# Patient Record
Sex: Male | Born: 1980 | Race: Black or African American | Hispanic: No | Marital: Single | Smoking: Former smoker
Health system: Southern US, Community
[De-identification: ages and names within clinical notes are randomized; demographics above are authoritative.]

## PROBLEM LIST (undated history)

## (undated) HISTORY — PX: FINGER SURGERY: SHX640

---

## 2009-03-04 ENCOUNTER — Ambulatory Visit: Payer: Self-pay | Admitting: Diagnostic Radiology

## 2009-03-04 ENCOUNTER — Emergency Department (HOSPITAL_BASED_OUTPATIENT_CLINIC_OR_DEPARTMENT_OTHER): Admission: EM | Admit: 2009-03-04 | Discharge: 2009-03-04 | Payer: Self-pay | Admitting: Emergency Medicine

## 2010-01-02 ENCOUNTER — Emergency Department (HOSPITAL_BASED_OUTPATIENT_CLINIC_OR_DEPARTMENT_OTHER): Admission: EM | Admit: 2010-01-02 | Discharge: 2010-01-02 | Payer: Self-pay | Admitting: Emergency Medicine

## 2010-01-02 ENCOUNTER — Ambulatory Visit: Payer: Self-pay | Admitting: Diagnostic Radiology

## 2011-12-07 ENCOUNTER — Encounter (HOSPITAL_BASED_OUTPATIENT_CLINIC_OR_DEPARTMENT_OTHER): Payer: Self-pay | Admitting: Emergency Medicine

## 2011-12-07 ENCOUNTER — Emergency Department (INDEPENDENT_AMBULATORY_CARE_PROVIDER_SITE_OTHER): Payer: No Typology Code available for payment source

## 2011-12-07 ENCOUNTER — Emergency Department (HOSPITAL_BASED_OUTPATIENT_CLINIC_OR_DEPARTMENT_OTHER)
Admission: EM | Admit: 2011-12-07 | Discharge: 2011-12-07 | Disposition: A | Discharge status: Home / Self Care | Payer: No Typology Code available for payment source | Attending: Emergency Medicine | Admitting: Emergency Medicine

## 2011-12-07 DIAGNOSIS — M25569 Pain in unspecified knee: Secondary | ICD-10-CM

## 2011-12-07 DIAGNOSIS — Z79899 Other long term (current) drug therapy: Secondary | ICD-10-CM | POA: Insufficient documentation

## 2011-12-07 DIAGNOSIS — Y92410 Unspecified street and highway as the place of occurrence of the external cause: Secondary | ICD-10-CM | POA: Insufficient documentation

## 2011-12-07 DIAGNOSIS — M549 Dorsalgia, unspecified: Secondary | ICD-10-CM

## 2011-12-07 DIAGNOSIS — M545 Low back pain: Secondary | ICD-10-CM

## 2011-12-07 MED ORDER — HYDROCODONE-ACETAMINOPHEN 5-325 MG PO TABS
1.0000 | ORAL_TABLET | Freq: Once | ORAL | Status: AC
  Administered 2011-12-07: 1 via ORAL
  Filled 2011-12-07: qty 1

## 2011-12-07 MED ORDER — OXYCODONE-ACETAMINOPHEN 5-325 MG PO TABS: 1.0000 | ORAL_TABLET | ORAL | Status: AC | PRN

## 2011-12-07 MED ORDER — CYCLOBENZAPRINE HCL 10 MG PO TABS: 10.0000 mg | ORAL_TABLET | Freq: Two times a day (BID) | ORAL | Status: AC | PRN

## 2011-12-07 MED ORDER — IBUPROFEN 800 MG PO TABS: 800.0000 mg | ORAL_TABLET | Freq: Three times a day (TID) | ORAL | Status: AC

## 2011-12-07 MED ORDER — IBUPROFEN 800 MG PO TABS
800.0000 mg | ORAL_TABLET | Freq: Once | ORAL | Status: AC
  Administered 2011-12-07: 800 mg via ORAL
  Filled 2011-12-07: qty 1
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
## 2012-11-12 ENCOUNTER — Emergency Department (HOSPITAL_BASED_OUTPATIENT_CLINIC_OR_DEPARTMENT_OTHER)
Admission: EM | Admit: 2012-11-12 | Discharge: 2012-11-12 | Disposition: A | Discharge status: Home / Self Care | Payer: Managed Care, Other (non HMO) | Attending: Emergency Medicine | Admitting: Emergency Medicine

## 2012-11-12 ENCOUNTER — Emergency Department (HOSPITAL_BASED_OUTPATIENT_CLINIC_OR_DEPARTMENT_OTHER): Payer: Managed Care, Other (non HMO)

## 2012-11-12 ENCOUNTER — Encounter (HOSPITAL_BASED_OUTPATIENT_CLINIC_OR_DEPARTMENT_OTHER): Payer: Self-pay | Admitting: *Deleted

## 2012-11-12 DIAGNOSIS — J069 Acute upper respiratory infection, unspecified: Secondary | ICD-10-CM | POA: Insufficient documentation

## 2012-11-12 MED ORDER — HYDROCOD POLST-CHLORPHEN POLST 10-8 MG/5ML PO LQCR: 5.0000 mL | Freq: Two times a day (BID) | ORAL | Status: DC | PRN

## 2012-11-12 NOTE — ED Notes (Signed)
Productive cough with yellow sputum. Aching all over and fever. Symptoms started yesterday.

## 2012-11-12 NOTE — ED Provider Notes (Signed)
History     CSN: [id number]  Arrival date & time 11/12/12  1325   First MD Initiated Contact with Patient 11/12/12 1348      Chief Complaint  Patient presents with  . Cough    (Consider location/radiation/quality/duration/timing/severity/associated sxs/prior treatment) Patient is a 32 y.o. male presenting with cough. The history is provided by the patient.  Cough This is a new problem. The current episode started yesterday. The problem occurs constantly. The problem has been rapidly worsening. The cough is productive of sputum. There has been no fever. Pertinent negatives include no chest pain, no sore throat and no shortness of breath. He has tried nothing for the symptoms. The treatment provided no relief. He is not a smoker.    History reviewed. No pertinent past medical history.  Past Surgical History  Procedure Date  . Finger surgery     No family history on file.  History  Substance Use Topics  . Smoking status: Never Smoker   . Smokeless tobacco: Not on file  . Alcohol Use: No      Review of Systems  HENT: Negative for sore throat.   Respiratory: Positive for cough. Negative for shortness of breath.   Cardiovascular: Negative for chest pain.  All other systems reviewed and are negative.    Allergies  Review of patient's allergies indicates no known allergies.  Home Medications  No current outpatient prescriptions on file.  BP 151/78  Pulse 99  Temp 99.6 F (37.6 C) (Oral)  Resp 20  Ht 6' (1.829 m)  Wt 200 lb (90.719 kg)  BMI 27.12 kg/m2  SpO2 100%  Physical Exam  Nursing note and vitals reviewed. Constitutional: He is oriented to person, place, and time. He appears well-developed and well-nourished. No distress.  HENT:  Head: Normocephalic and atraumatic.  Mouth/Throat: Oropharynx is clear and moist.  Neck: Normal range of motion. Neck supple.  Cardiovascular: Normal rate and regular rhythm.   No murmur heard. Pulmonary/Chest: Effort normal  and breath sounds normal. No respiratory distress. He has no wheezes.  Abdominal: Soft. Bowel sounds are normal. He exhibits no distension. There is no tenderness.  Musculoskeletal: Normal range of motion. He exhibits no edema.  Lymphadenopathy:    He has no cervical adenopathy.  Neurological: He is alert and oriented to person, place, and time.  Skin: Skin is warm and dry. He is not diaphoretic.    ED Course  Procedures (including critical care time)  Labs Reviewed - No data to display Dg Chest 2 View  11/12/2012  *RADIOLOGY REPORT*  Clinical Data: Cough and fever  CHEST - 2 VIEW  Comparison: 01/02/2010  Findings: The heart and pulmonary vascularity are within normal limits.  The lungs are clear bilaterally.  Bony structures are within normal limits.  IMPRESSION: No acute abnormality is noted.   Original Report Authenticated By: Alcide Clever, M.D.      No diagnosis found.    MDM  The patient presents with chest congestion and cough.  The xray is negative.  He will be treated with tussionex, return prn.        Geoffery Lyons, MD 11/12/12 [phone]

## 2016-02-22 ENCOUNTER — Encounter (HOSPITAL_BASED_OUTPATIENT_CLINIC_OR_DEPARTMENT_OTHER): Payer: Self-pay | Admitting: *Deleted

## 2016-02-22 DIAGNOSIS — Y929 Unspecified place or not applicable: Secondary | ICD-10-CM | POA: Diagnosis not present

## 2016-02-22 DIAGNOSIS — Y999 Unspecified external cause status: Secondary | ICD-10-CM | POA: Insufficient documentation

## 2016-02-22 DIAGNOSIS — S39012A Strain of muscle, fascia and tendon of lower back, initial encounter: Secondary | ICD-10-CM | POA: Diagnosis not present

## 2016-02-22 DIAGNOSIS — Y939 Activity, unspecified: Secondary | ICD-10-CM | POA: Diagnosis not present

## 2016-02-22 DIAGNOSIS — S3992XA Unspecified injury of lower back, initial encounter: Secondary | ICD-10-CM | POA: Diagnosis present
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
## 2016-02-23 ENCOUNTER — Emergency Department (HOSPITAL_BASED_OUTPATIENT_CLINIC_OR_DEPARTMENT_OTHER)
Admission: EM | Admit: 2016-02-23 | Discharge: 2016-02-23 | Disposition: A | Discharge status: Home / Self Care | Payer: No Typology Code available for payment source | Attending: Emergency Medicine | Admitting: Emergency Medicine

## 2016-02-23 DIAGNOSIS — T148XXA Other injury of unspecified body region, initial encounter: Secondary | ICD-10-CM

## 2016-02-23 MED ORDER — IBUPROFEN 800 MG PO TABS: 800.0000 mg | ORAL_TABLET | Freq: Three times a day (TID) | ORAL | Status: DC

## 2016-02-23 MED ORDER — CYCLOBENZAPRINE HCL 10 MG PO TABS: 10.0000 mg | ORAL_TABLET | Freq: Two times a day (BID) | ORAL | Status: DC | PRN

## 2016-02-23 NOTE — ED Notes (Signed)
Pt denies any further needs at this time.  He is on facebook during discharge.
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
## 2018-01-12 ENCOUNTER — Emergency Department (HOSPITAL_BASED_OUTPATIENT_CLINIC_OR_DEPARTMENT_OTHER)
Admission: EM | Admit: 2018-01-12 | Discharge: 2018-01-12 | Disposition: A | Discharge status: Home / Self Care | Payer: Self-pay | Attending: Emergency Medicine | Admitting: Emergency Medicine

## 2018-01-12 ENCOUNTER — Encounter (HOSPITAL_BASED_OUTPATIENT_CLINIC_OR_DEPARTMENT_OTHER): Payer: Self-pay | Admitting: *Deleted

## 2018-01-12 ENCOUNTER — Other Ambulatory Visit: Payer: Self-pay

## 2018-01-12 DIAGNOSIS — M545 Low back pain, unspecified: Secondary | ICD-10-CM

## 2018-01-12 MED ORDER — METHOCARBAMOL 500 MG PO TABS: 500.0000 mg | ORAL_TABLET | Freq: Two times a day (BID) | ORAL | 0 refills | Status: DC

## 2018-01-12 MED ORDER — PREDNISONE 20 MG PO TABS: 40.0000 mg | ORAL_TABLET | Freq: Every day | ORAL | 0 refills | Status: DC

## 2018-01-12 MED ORDER — OXYCODONE-ACETAMINOPHEN 5-325 MG PO TABS
1.0000 | ORAL_TABLET | Freq: Once | ORAL | Status: AC
  Administered 2018-01-12: 1 via ORAL
  Filled 2018-01-12: qty 1

## 2018-01-12 MED ORDER — KETOROLAC TROMETHAMINE 60 MG/2ML IM SOLN
60.0000 mg | Freq: Once | INTRAMUSCULAR | Status: AC
  Administered 2018-01-12: 60 mg via INTRAMUSCULAR
  Filled 2018-01-12: qty 2

## 2018-01-12 NOTE — Discharge Instructions (Signed)
Take 800 mg ibuprofen every 6 hours for pain.   Take steroid for next 5 days to help with inflammation.   I have also written you a description for muscle relaxer medicine (Robaxin.)  This medicine can make you drowsy so please do not drive or work while taking it.  You can apply heat to the lower back to help with your symptoms as well.  Return to the emergency department if you have loss of bowel or bladder control, weakness in which he can no longer walk, loss of sensation in your feet or legs or have any new or concerning symptoms.
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
## 2018-01-12 NOTE — ED Provider Notes (Signed)
[facility] EMERGENCY DEPARTMENT Provider Note   CSN: [id number] Arrival date & time: 01/12/18  1932     History   Chief Complaint Chief Complaint  Patient presents with  . Back Pain    HPI Jose Bean is a 37 y.o. male.  HPI  Jose Bean is a 37yo male with no significant past medical history who presents to the emergency department for evaluation of back pain.  Patient reports that earlier today he bent over to pick up a car battery and pulled out his back.  Reports that he now has 10/10 severity left lower back pain which does not radiate.  Pain is worsened when he tries to stand from a seated position.  He has not taken any over-the-counter medications for his symptoms.  Denies history of back problems in the past.  Denies fevers, chills, night sweats, unexpected weight changes, numbness, weakness, saddle anesthesia, loss of bowel or bladder control, abdominal pain, nausea/vomiting, dysuria, urinary frequency.  Denies history of IV drug use or personal history of cancer. He is able to ambulate independently despite pain.  History reviewed. No pertinent past medical history.  There are no active problems to display for this patient.   Past Surgical History:  Procedure Laterality Date  . FINGER SURGERY         Home Medications    Prior to Admission medications   Not on File    Family History History reviewed. No pertinent family history.  Social History Social History   Tobacco Use  . Smoking status: Never Smoker  Substance Use Topics  . Alcohol use: No  . Drug use: Yes    Types: Marijuana     Allergies   Patient has no known allergies.   Review of Systems Review of Systems  Constitutional: Negative for chills and fever.  Gastrointestinal: Negative for abdominal pain, nausea and vomiting.  Genitourinary: Negative for difficulty urinating, dysuria, frequency and hematuria.  Musculoskeletal: Positive for back pain. Negative for gait  problem.  Skin: Negative for rash and wound.  Neurological: Negative for weakness and numbness.     Physical Exam Updated Vital Signs BP 132/73 (BP Location: Right Arm)   Pulse 87   Temp 98.3 F (36.8 C) (Oral)   Resp 18   Ht 6' (1.829 m)   Wt 99.8 kg (220 lb)   SpO2 100%   BMI 29.84 kg/m   Physical Exam  Constitutional: He is oriented to person, place, and time. He appears well-developed and well-nourished. No distress.  HENT:  Head: Normocephalic and atraumatic.  Eyes: Right eye exhibits no discharge. Left eye exhibits no discharge.  Pulmonary/Chest: Effort normal. No respiratory distress.  Abdominal: Soft. Bowel sounds are normal. There is no tenderness. There is no guarding.  No CVA tenderness.  Musculoskeletal:  No midline t-spine or l-spine tenderness. Tender to palpation in left paraspinal muscles of the lumbar spine. No overlying rash or ecchymosis. Strength 5/5 in bilateral knee flexion/extension. DP pulses 2+ bilaterally.   Neurological: He is alert and oriented to person, place, and time. Coordination normal.  Patellar reflex 2+ and symmetric bilaterally.  Distal sensation to light touch intact in bilateral lower extremities.  Gait painful, although normal and coordination and balance.  Skin: Skin is warm and dry. Capillary refill takes less than 2 seconds. He is not diaphoretic.  Psychiatric: He has a normal mood and affect. His behavior is normal.  Nursing note and vitals reviewed.    ED Treatments / Results  Labs (  all labs ordered are listed, but only abnormal results are displayed) Labs Reviewed - No data to display  EKG  EKG Interpretation None       Radiology No results found.  Procedures Procedures (including critical care time)  Medications Ordered in ED Medications  oxyCODONE-acetaminophen (PERCOCET/ROXICET) 5-325 MG per tablet 1 tablet (1 tablet Oral Given 01/12/18 2052)  ketorolac (TORADOL) injection 60 mg (60 mg Intramuscular Given 01/12/18  2052)  oxyCODONE-acetaminophen (PERCOCET/ROXICET) 5-325 MG per tablet 1 tablet (1 tablet Oral Given 01/12/18 2120)     Initial Impression / Assessment and Plan / ED Course  I have reviewed the triage vital signs and the nursing notes.  Pertinent labs & imaging results that were available during my care of the patient were reviewed by me and considered in my medical decision making (see chart for details).     Presentation and exam consistent with musculoskeletal back pain. No neurological deficits and normal neuro exam.  Patient can walk but states is painful. No loss of bowel or bladder control. No concern for cauda equina.  No fever, night sweats, weight loss, h/o cancer, IVDU. RICE protocol and pain medicine indicated and discussed with patient.  Counseled patient that muscle relaxer can make him drowsy and he should not drive or work while taking it. Discussed return precautions and patient agrees and voices understanding to the above plan.   Final Clinical Impressions(s) / ED Diagnoses   Final diagnoses:  Acute left-sided low back pain without sciatica    ED Discharge Orders        Ordered    methocarbamol (ROBAXIN) 500 MG tablet  2 times daily     01/12/18 2107    predniSONE (DELTASONE) 20 MG tablet  Daily     01/12/18 2107       Kellie ShropshireShrosbree, Faiza Bansal J, PA-C 01/13/18 0100    Arby BarrettePfeiffer, Marcy, MD 01/16/18 [phone]

## 2018-07-05 ENCOUNTER — Encounter (HOSPITAL_BASED_OUTPATIENT_CLINIC_OR_DEPARTMENT_OTHER): Payer: Self-pay | Admitting: Emergency Medicine

## 2018-07-05 ENCOUNTER — Other Ambulatory Visit: Payer: Self-pay

## 2018-07-05 ENCOUNTER — Emergency Department (HOSPITAL_BASED_OUTPATIENT_CLINIC_OR_DEPARTMENT_OTHER)
Admission: EM | Admit: 2018-07-05 | Discharge: 2018-07-05 | Disposition: A | Discharge status: Home / Self Care | Payer: Self-pay | Attending: Emergency Medicine | Admitting: Emergency Medicine

## 2018-07-05 DIAGNOSIS — R1030 Lower abdominal pain, unspecified: Secondary | ICD-10-CM | POA: Insufficient documentation

## 2018-07-05 DIAGNOSIS — R197 Diarrhea, unspecified: Secondary | ICD-10-CM | POA: Insufficient documentation

## 2018-07-05 DIAGNOSIS — F17290 Nicotine dependence, other tobacco product, uncomplicated: Secondary | ICD-10-CM | POA: Insufficient documentation

## 2018-07-05 MED ORDER — DICYCLOMINE HCL 20 MG PO TABS: 20.0000 mg | ORAL_TABLET | Freq: Two times a day (BID) | ORAL | 0 refills | Status: AC

## 2018-07-05 MED ORDER — ONDANSETRON 4 MG PO TBDP: 4.0000 mg | ORAL_TABLET | Freq: Three times a day (TID) | ORAL | 0 refills | Status: AC | PRN

## 2018-07-05 MED ORDER — DICYCLOMINE HCL 10 MG PO CAPS
10.0000 mg | ORAL_CAPSULE | Freq: Once | ORAL | Status: AC
Start: 2018-07-05 — End: 2018-07-05
  Administered 2018-07-05: 10 mg via ORAL
  Filled 2018-07-05: qty 1

## 2018-07-05 NOTE — ED Triage Notes (Signed)
Generalized intermittent abdominal pain since Saturday.  2 episodes diarrhea in last 24 hours.  No N/V.

## 2018-07-05 NOTE — Discharge Instructions (Addendum)
You were evaluated in the Emergency Department and after careful evaluation, we did not find any emergent condition requiring admission or further testing in the hospital.  Your symptoms today seem to be due to a virus causing diarrhea, abdominal cramping related to mild irritation.  Please use the medications provided as needed for your symptoms.  As discussed, if your pain becomes worse or if you experience fevers, please return to emergency department for evaluation.  Please return to the Emergency Department if you experience any worsening of your condition.  We encourage you to follow up with a primary care provider.  Thank you for allowing us to be a part of your care.

## 2018-07-05 NOTE — ED Provider Notes (Signed)
[facility] Emergency Department Provider Note MRN:  [id number]  Arrival date & time: 07/05/18     Chief Complaint   Abdominal Pain   History of Present Illness   Jose Bean is a 37 y.o. year-old male with no pertinent past medical history presenting to the ED with chief complaint of abdominal pain.  For the past 2 to 3 days, patient has been experiencing constant, watery diarrhea.  Denies blood in the stool.  Last night, began expressing gradual onset diffuse abdominal cramping sensation, mild in severity, constant, mostly in the bilateral lower abdominal regions.  Denies nausea or vomiting, no fevers, no chest pain or shortness of breath, no upper abdominal pain, no dysuria.  No exacerbating relieving factors.  Review of Systems  A complete 10 system review of systems was obtained and all systems are negative except as noted in the HPI and PMH.   Patient's Health History   No past medical history on file.  Past Surgical History:  Procedure Laterality Date  . FINGER SURGERY      No family history on file.  Social History   Socioeconomic History  . Marital status: Single    Spouse name: Not on file  . Number of children: Not on file  . Years of education: Not on file  . Highest education level: Not on file  Occupational History  . Not on file  Social Needs  . Financial resource strain: Not on file  . Food insecurity:    Worry: Not on file    Inability: Not on file  . Transportation needs:    Medical: Not on file    Non-medical: Not on file  Tobacco Use  . Smoking status: Current Some Day Smoker    Types: Cigars  . Smokeless tobacco: Never Used  Substance and Sexual Activity  . Alcohol use: No  . Drug use: Yes    Types: Marijuana  . Sexual activity: Not on file  Lifestyle  . Physical activity:    Days per week: Not on file    Minutes per session: Not on file  . Stress: Not on file  Relationships  . Social connections:    Talks  on phone: Not on file    Gets together: Not on file    Attends religious service: Not on file    Active member of club or organization: Not on file    Attends meetings of clubs or organizations: Not on file    Relationship status: Not on file  . Intimate partner violence:    Fear of current or ex partner: Not on file    Emotionally abused: Not on file    Physically abused: Not on file    Forced sexual activity: Not on file  Other Topics Concern  . Not on file  Social History Narrative  . Not on file     Physical Exam  Vital Signs and Nursing Notes reviewed Vitals:   07/05/18 0915  BP: 136/82  Pulse: 79  Resp: 14  Temp: 98.5 F (36.9 C)  SpO2: 100%    CONSTITUTIONAL: Well-appearing, NAD NEURO:  Alert and oriented x 3, no focal deficits EYES:  eyes equal and reactive ENT/NECK:  no LAD, no JVD CARDIO: Regular rate, well-perfused, normal S1 and S2 PULM:  CTAB no wheezing or rhonchi GI/GU:  normal bowel sounds, non-distended, non-tender MSK/SPINE:  No gross deformities, no edema SKIN:  no rash, atraumatic PSYCH:  Appropriate speech and behavior  Diagnostic and  Interventional Summary    EKG Interpretation  Date/Time:    Ventricular Rate:    PR Interval:    QRS Duration:   QT Interval:    QTC Calculation:   R Axis:     Text Interpretation:        Labs Reviewed - No data to display  No orders to display    Medications  dicyclomine (BENTYL) capsule 10 mg (has no administration in time range)     Procedures Critical Care  ED Course and Medical Decision Making  I have reviewed the triage vital signs and the nursing notes.  Pertinent labs & imaging results that were available during my care of the patient were reviewed by me and considered in my medical decision making (see below for details).    Very soft and reassuring abdominal exam in this healthy 37 year old male presenting with likely viral diarrhea.  Vital signs stable, afebrile, no focal tenderness, no  McBurney's point tenderness, very low concern for appendicitis at this time.  Will provide symptomatic control with prescriptions for Bentyl and Zofran if nausea were to develop.  Provided specific instructions to return to the ED with worsening of pain or pain becoming more focal in the right lower quadrant.  After the discussed management above, the patient was determined to be safe for discharge.  The patient was in agreement with this plan and all questions regarding their care were answered.  ED return precautions were discussed and the patient will return to the ED with any significant worsening of condition.  Elmer Sow. Pilar Plate, MD [facility] Emergency Medicine [facility] [email or web address]  Final Clinical Impressions(s) / ED Diagnoses     ICD-10-CM   1. Diarrhea of presumed infectious origin R19.7   2. Lower abdominal pain R10.30     ED Discharge Orders    None         Sabas Sous, MD 07/05/18 1012

## 2018-07-06 ENCOUNTER — Other Ambulatory Visit: Payer: Self-pay

## 2018-07-06 ENCOUNTER — Encounter (HOSPITAL_BASED_OUTPATIENT_CLINIC_OR_DEPARTMENT_OTHER): Payer: Self-pay | Admitting: *Deleted

## 2018-07-06 ENCOUNTER — Emergency Department (HOSPITAL_BASED_OUTPATIENT_CLINIC_OR_DEPARTMENT_OTHER)
Admission: EM | Admit: 2018-07-06 | Discharge: 2018-07-06 | Disposition: A | Discharge status: Home / Self Care | Payer: Self-pay | Attending: Emergency Medicine | Admitting: Emergency Medicine

## 2018-07-06 DIAGNOSIS — F17290 Nicotine dependence, other tobacco product, uncomplicated: Secondary | ICD-10-CM | POA: Insufficient documentation

## 2018-07-06 DIAGNOSIS — R1012 Left upper quadrant pain: Secondary | ICD-10-CM | POA: Insufficient documentation

## 2018-07-06 NOTE — ED Provider Notes (Signed)
[facility] EMERGENCY DEPARTMENT Provider Note  CSN: [id number] Arrival date & time: 07/06/18  1438    History   Chief Complaint No chief complaint on file.   HPI Jose Bean is a 37 y.o. male with no significant medical history who presented to the ED for abdominal pain. Patient describes an acute episode of LUQ abdominal pain that lasted ~ 15 to 30 minutes, but resolved becoming to the ED without intervention. Denies fever, chest pain, SOB, palpitations, leg swelling or N/V. Patient states he came to the ED yesterday 07/05/18 for similar complaints and diarrhea. He states diarrhea has resolved and that he had a normal bowel movement today.  History reviewed. No pertinent past medical history.  There are no active problems to display for this patient.   Past Surgical History:  Procedure Laterality Date  . FINGER SURGERY          Home Medications    Prior to Admission medications   Medication Sig Start Date End Date Taking? Authorizing Provider  dicyclomine (BENTYL) 20 MG tablet Take 1 tablet (20 mg total) by mouth 2 (two) times daily. 07/05/18   Sabas SousBero, Michael M, MD  ondansetron (ZOFRAN ODT) 4 MG disintegrating tablet Take 1 tablet (4 mg total) by mouth every 8 (eight) hours as needed for nausea or vomiting. 07/05/18   Sabas SousBero, Michael M, MD    Family History No family history on file.  Social History Social History   Tobacco Use  . Smoking status: Current Some Day Smoker    Types: Cigars  . Smokeless tobacco: Never Used  Substance Use Topics  . Alcohol use: No  . Drug use: Yes    Types: Marijuana     Allergies   Patient has no known allergies.   Review of Systems Review of Systems  Constitutional: Negative for fever.  Respiratory: Negative for chest tightness and shortness of breath.   Cardiovascular: Negative for chest pain, palpitations and leg swelling.  Gastrointestinal: Positive for abdominal pain. Negative for blood in stool, constipation,  diarrhea, nausea and vomiting.  Musculoskeletal: Negative.   Skin: Negative.      Physical Exam Updated Vital Signs BP 119/79   Pulse 74   Temp 97.8 F (36.6 C) (Oral)   Resp 18   Ht 6' (1.829 m)   Wt 98.8 kg   SpO2 100%   BMI 29.54 kg/m   Physical Exam  Constitutional: Vital signs are normal. He appears well-developed and well-nourished. No distress.  Cardiovascular: Normal rate, regular rhythm and normal heart sounds.  Pulmonary/Chest: Effort normal and breath sounds normal.  Abdominal: Soft. Normal appearance and bowel sounds are normal. He exhibits no distension. There is no tenderness.  Dullness to percussion in lower quadrants.  Skin: Skin is warm and intact. Capillary refill takes less than 2 seconds.  Psychiatric: He has a normal mood and affect. His speech is normal and behavior is normal. Thought content normal. Cognition and memory are normal.  Nursing note and vitals reviewed.  ED Treatments / Results  Labs (all labs ordered are listed, but only abnormal results are displayed) Labs Reviewed - No data to display  EKG None  Radiology No results found.  Procedures Procedures (including critical care time)  Medications Ordered in ED Medications - No data to display   Initial Impression / Assessment and Plan / ED Course  Triage vital signs and the nursing notes have been reviewed.  Pertinent labs & imaging results that were available during care of the  patient were reviewed and considered in medical decision making (see chart for details).  Patient presents for an acute episode of left sided abdominal pain that resolved before arrival to the ED. There were no other associated symptoms during this episode that suggestive an infectious or cardiac process. Physical exam today was normal. Patient seen in the ED yesterday 07/05/18 for abdominal complaints. Based on medical record review, there have been no acute changes or worsening in patient's physical  complaints. In fact, patient appears to have improved. Prior to this provider's evaluation, patient states that he was going to leave the ED and take some laxatives at home because he think he could be constipated.   Final Clinical Impressions(s) / ED Diagnoses  1. LUQ Pain. Education provided on OTC and symptomatic treatments for relief. Education on strict return precautions.  Dispo: Home. After thorough clinical evaluation, this patient is determined to be medically stable and can be safely discharged with the previously mentioned treatment and/or outpatient follow-up/referral(s). At this time, there are no other apparent medical conditions that require further screening, evaluation or treatment.   Final diagnoses:  Left upper quadrant pain    ED Discharge Orders    None        Windy Carina, PA-C 07/06/18 1806    Virgina Norfolk, DO 07/07/18 1120

## 2018-07-06 NOTE — ED Triage Notes (Signed)
Abdominal pain that came and went away after having a BM. He was seen yesterday for same.

## 2018-07-06 NOTE — ED Notes (Signed)
Pt left department before receiving d/c paperwork. Pt approached this RN in the hallway and reported he was just going to go home.

## 2018-07-06 NOTE — Discharge Instructions (Addendum)
Your physical exam suggests that you may be constipated. I recommend that you take over the counter laxatives or stool softeners over the next couple of days to assist with this. You may continue taking the dicylcomine (Bentyl) that you were given yesterday for stomach cramps.  Below are reasons to follow-up to the emergency room: fever; abdominal pain that does not go away; abdominal pain with vomiting (more than 3 times); blood in your poop or dark poop.

## 2018-07-10 ENCOUNTER — Other Ambulatory Visit: Payer: Self-pay

## 2018-07-10 ENCOUNTER — Encounter (HOSPITAL_BASED_OUTPATIENT_CLINIC_OR_DEPARTMENT_OTHER): Payer: Self-pay | Admitting: *Deleted

## 2018-07-10 ENCOUNTER — Emergency Department (HOSPITAL_BASED_OUTPATIENT_CLINIC_OR_DEPARTMENT_OTHER)
Admission: EM | Admit: 2018-07-10 | Discharge: 2018-07-10 | Disposition: A | Discharge status: Home / Self Care | Payer: Self-pay | Attending: Emergency Medicine | Admitting: Emergency Medicine

## 2018-07-10 ENCOUNTER — Emergency Department (HOSPITAL_BASED_OUTPATIENT_CLINIC_OR_DEPARTMENT_OTHER): Payer: Worker's Compensation

## 2018-07-10 DIAGNOSIS — Y939 Activity, unspecified: Secondary | ICD-10-CM | POA: Insufficient documentation

## 2018-07-10 DIAGNOSIS — F17200 Nicotine dependence, unspecified, uncomplicated: Secondary | ICD-10-CM | POA: Insufficient documentation

## 2018-07-10 DIAGNOSIS — Y990 Civilian activity done for income or pay: Secondary | ICD-10-CM | POA: Insufficient documentation

## 2018-07-10 DIAGNOSIS — S39012A Strain of muscle, fascia and tendon of lower back, initial encounter: Secondary | ICD-10-CM | POA: Insufficient documentation

## 2018-07-10 DIAGNOSIS — Y9259 Other trade areas as the place of occurrence of the external cause: Secondary | ICD-10-CM | POA: Insufficient documentation

## 2018-07-10 MED ORDER — NAPROXEN 500 MG PO TABS: 500.0000 mg | ORAL_TABLET | Freq: Two times a day (BID) | ORAL | 0 refills | Status: AC

## 2018-07-10 MED ORDER — CYCLOBENZAPRINE HCL 10 MG PO TABS: 10.0000 mg | ORAL_TABLET | Freq: Two times a day (BID) | ORAL | 0 refills | Status: AC | PRN

## 2018-07-10 NOTE — ED Notes (Signed)
ED Provider at bedside. 
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
## 2018-07-10 NOTE — Discharge Instructions (Signed)
Take the medications as needed, symptoms should improve over the next week or so
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
## 2020-05-23 ENCOUNTER — Emergency Department (HOSPITAL_BASED_OUTPATIENT_CLINIC_OR_DEPARTMENT_OTHER)
Admission: EM | Admit: 2020-05-23 | Discharge: 2020-05-23 | Disposition: A | Discharge status: Home / Self Care | Payer: Self-pay | Attending: Emergency Medicine | Admitting: Emergency Medicine

## 2020-05-23 ENCOUNTER — Other Ambulatory Visit: Payer: Self-pay

## 2020-05-23 ENCOUNTER — Encounter (HOSPITAL_BASED_OUTPATIENT_CLINIC_OR_DEPARTMENT_OTHER): Payer: Self-pay

## 2020-05-23 DIAGNOSIS — R519 Headache, unspecified: Secondary | ICD-10-CM | POA: Insufficient documentation

## 2020-05-23 DIAGNOSIS — R6883 Chills (without fever): Secondary | ICD-10-CM | POA: Insufficient documentation

## 2020-05-23 DIAGNOSIS — R5383 Other fatigue: Secondary | ICD-10-CM | POA: Insufficient documentation

## 2020-05-23 DIAGNOSIS — Z5321 Procedure and treatment not carried out due to patient leaving prior to being seen by health care provider: Secondary | ICD-10-CM | POA: Insufficient documentation

## 2020-05-23 NOTE — ED Notes (Signed)
Called name in lobby with no response.

## 2020-05-23 NOTE — ED Triage Notes (Signed)
Pt c/o chills, HA, fatigue x 2 days "covid symptoms"-NAD-steady gait

## 2020-05-24 ENCOUNTER — Encounter (HOSPITAL_BASED_OUTPATIENT_CLINIC_OR_DEPARTMENT_OTHER): Payer: Self-pay

## 2020-05-24 ENCOUNTER — Emergency Department (HOSPITAL_BASED_OUTPATIENT_CLINIC_OR_DEPARTMENT_OTHER)
Admission: EM | Admit: 2020-05-24 | Discharge: 2020-05-24 | Disposition: A | Discharge status: Home / Self Care | Payer: HRSA Program | Attending: Emergency Medicine | Admitting: Emergency Medicine

## 2020-05-24 ENCOUNTER — Other Ambulatory Visit: Payer: Self-pay

## 2020-05-24 DIAGNOSIS — Z87891 Personal history of nicotine dependence: Secondary | ICD-10-CM | POA: Insufficient documentation

## 2020-05-24 DIAGNOSIS — U071 COVID-19: Secondary | ICD-10-CM | POA: Insufficient documentation

## 2020-05-24 DIAGNOSIS — R6883 Chills (without fever): Secondary | ICD-10-CM

## 2020-05-24 DIAGNOSIS — J069 Acute upper respiratory infection, unspecified: Secondary | ICD-10-CM

## 2020-05-24 DIAGNOSIS — R05 Cough: Secondary | ICD-10-CM | POA: Diagnosis present

## 2020-05-24 MED ORDER — ACETAMINOPHEN 325 MG PO TABS
650.0000 mg | ORAL_TABLET | Freq: Once | ORAL | Status: AC
  Administered 2020-05-24: 650 mg via ORAL
  Filled 2020-05-24: qty 2

## 2020-05-24 NOTE — ED Provider Notes (Addendum)
[facility] EMERGENCY DEPARTMENT Provider Note   CSN: [id number] Arrival date & time: 05/24/20  2037     History Chief Complaint  Patient presents with  . Chills    Jose Bean is a 39 y.o. male.  HPI Patient is a 39 year old male with no past medical history presented today with cough chills, and fatigue body aches loss of taste and smell.  He states that his brother was recently diagnosed with Covid.  He has not been vaccinated.  He denies any fevers, chest pain or shortness of breath.  Denies any heart palpitations.  States that he does have a no dyspnea on exertion wheezing and states that his cough is mild. He states that he was told to have testing done by his work.     History reviewed. No pertinent past medical history.  There are no problems to display for this patient.   Past Surgical History:  Procedure Laterality Date  . FINGER SURGERY         No family history on file.  Social History   Tobacco Use  . Smoking status: Former Smoker    Types: Cigars  . Smokeless tobacco: Never Used  Vaping Use  . Vaping Use: Never used  Substance Use Topics  . Alcohol use: No  . Drug use: Yes    Types: Marijuana    Home Medications Prior to Admission medications   Medication Sig Start Date End Date Taking? Authorizing Provider  cyclobenzaprine (FLEXERIL) 10 MG tablet Take 1 tablet (10 mg total) by mouth 2 (two) times daily as needed for muscle spasms. 07/10/18   Linwood Dibbles, MD  dicyclomine (BENTYL) 20 MG tablet Take 1 tablet (20 mg total) by mouth 2 (two) times daily. 07/05/18   Sabas Sous, MD  naproxen (NAPROSYN) 500 MG tablet Take 1 tablet (500 mg total) by mouth 2 (two) times daily with a meal. As needed for pain 07/10/18   Linwood Dibbles, MD  ondansetron (ZOFRAN ODT) 4 MG disintegrating tablet Take 1 tablet (4 mg total) by mouth every 8 (eight) hours as needed for nausea or vomiting. 07/05/18   Sabas Sous, MD    Allergies    Patient has no  known allergies.  Review of Systems   Review of Systems  Constitutional: Positive for appetite change, chills and fatigue. Negative for fever.  HENT: Positive for congestion and rhinorrhea. Negative for ear pain.   Eyes: Negative for pain.  Respiratory: Positive for cough. Negative for shortness of breath.   Cardiovascular: Negative for chest pain and leg swelling.  Gastrointestinal: Negative for abdominal pain, diarrhea, nausea and vomiting.  Genitourinary: Negative for dysuria.  Musculoskeletal: Positive for myalgias.  Skin: Negative for rash.  Neurological: Negative for dizziness and headaches.    Physical Exam Updated Vital Signs BP (!) 126/92 (BP Location: Left Arm)   Pulse 81   Temp 98.4 F (36.9 C) (Oral)   Resp 16   SpO2 100%   Physical Exam Vitals and nursing note reviewed.  Constitutional:      General: He is not in acute distress. HENT:     Head: Normocephalic and atraumatic.     Nose: Nose normal.  Eyes:     General: No scleral icterus. Cardiovascular:     Rate and Rhythm: Normal rate and regular rhythm.     Pulses: Normal pulses.     Heart sounds: Normal heart sounds.  Pulmonary:     Effort: Pulmonary effort is normal. No respiratory distress.  Breath sounds: Normal breath sounds. No wheezing or rhonchi.     Comments: No increased work of breathing.  Speaking full sentences.  SPO2 100% on room air during my assessment.  Lungs are clear to auscultation all fields. Abdominal:     Palpations: Abdomen is soft.     Tenderness: There is no abdominal tenderness. There is no guarding or rebound.  Musculoskeletal:     Cervical back: Normal range of motion.     Right lower leg: No edema.     Left lower leg: No edema.  Skin:    General: Skin is warm and dry.     Capillary Refill: Capillary refill takes less than 2 seconds.  Neurological:     Mental Status: He is alert. Mental status is at baseline.  Psychiatric:        Mood and Affect: Mood normal.         Behavior: Behavior normal.     ED Results / Procedures / Treatments   Labs (all labs ordered are listed, but only abnormal results are displayed) Labs Reviewed  SARS CORONAVIRUS 2 BY RT PCR (HOSPITAL ORDER, PERFORMED IN [facility])    EKG None  Radiology No results found.  Procedures Procedures (including critical care time)  Medications Ordered in ED Medications  acetaminophen (TYLENOL) tablet 650 mg (has no administration in time range)    ED Course  I have reviewed the triage vital signs and the nursing notes.  Pertinent labs & imaging results that were available during my care of the patient were reviewed by me and considered in my medical decision making (see chart for details).    MDM Rules/Calculators/A&P                          Is well-appearing 39 year old gentleman presented today with symptoms consistent with Covid or other viral URI.  Vital signs are within normal limits and physical exam is unremarkable.  Covid swab ordered and pending.  He states he would prefer to be discharged this time and to be called if he has a positive test.  I discussed with charge nurse who states that this is still an option that he will be called if this test is positive.  Provided patient with a work note stating that he should be excused from work if his Covid test is positive--and to follow CDC guidelines on this.  Patient discharged with conservative therapy, Tylenol, ibuprofen recommendations given as well vitamin D vitamin C and zinc recommendations.  Patient is having presented room air, has normal lung exam and was given strict return precautions.  He is agreeable to plan.  Discharge at this time.  Jose Bean was evaluated in Emergency Department on 05/24/2020 for the symptoms described in the history of present illness. He was evaluated in the context of the global COVID-19 pandemic, which necessitated consideration that the patient might be at risk for  infection with the SARS-CoV-2 virus that causes COVID-19. Institutional protocols and algorithms that pertain to the evaluation of patients at risk for COVID-19 are in a state of rapid change based on information released by regulatory bodies including the CDC and federal and state organizations. These policies and algorithms were followed during the patient's care in the ED.  Final Clinical Impression(s) / ED Diagnoses Final diagnoses:  Chills  Upper respiratory tract infection, unspecified type    Rx / DC Orders ED Discharge Orders    None  Gailen Shelter, [place] 05/24/20 2313    Gailen Shelter, [place] 05/24/20 2314    Melene Plan, DO 05/24/20 2343

## 2020-05-24 NOTE — Discharge Instructions (Signed)
Your COVID test is pending; you should expect results in 2-3 days. You can access your results on your MyChart--if you test positive you should receive a phone call.  In the meantime follow CDC guidelines and quarantine, wear a mask, wash hands often.   Please take over the counter vitamin D 2000-4000 units per day. I also recommend zinc 50 mg per day for the next two weeks.   Please return to ED if you feel have difficulty breathing or have emergent, new or concerning symptoms.  Patients who have symptoms consistent with COVID-19 should self isolated for: At least 3 days (72 hours) have passed since recovery, defined as resolution of fever without the use of fever reducing medications and improvement in respiratory symptoms (e.g., cough, shortness of breath), and At least 7 days have passed since symptoms first appeared.       Person Under Monitoring Name: Jose Bean  Location: [address] [place] [address]   Infection Prevention Recommendations for Individuals Confirmed to have, or Being Evaluated for, 2019 Novel Coronavirus (COVID-19) Infection Who Receive Care at Home  Individuals who are confirmed to have, or are being evaluated for, COVID-19 should follow the prevention steps below until a healthcare provider or local or state health department says they can return to normal activities.  Stay home except to get medical care You should restrict activities outside your home, except for getting medical care. Do not go to work, school, or public areas, and do not use public transportation or taxis.  Call ahead before visiting your doctor Before your medical appointment, call the healthcare provider and tell them that you have, or are being evaluated for, COVID-19 infection. This will help the healthcare providers office take steps to keep other people from getting infected. Ask your healthcare provider to call the local or state health department.  Monitor your  symptoms Seek prompt medical attention if your illness is worsening (e.g., difficulty breathing). Before going to your medical appointment, call the healthcare provider and tell them that you have, or are being evaluated for, COVID-19 infection. Ask your healthcare provider to call the local or state health department.  Wear a facemask You should wear a facemask that covers your nose and mouth when you are in the same room with other people and when you visit a healthcare provider. People who live with or visit you should also wear a facemask while they are in the same room with you.  Separate yourself from other people in your home As much as possible, you should stay in a different room from other people in your home. Also, you should use a separate bathroom, if available.  Avoid sharing household items You should not share dishes, drinking glasses, cups, eating utensils, towels, bedding, or other items with other people in your home. After using these items, you should wash them thoroughly with soap and water.  Cover your coughs and sneezes Cover your mouth and nose with a tissue when you cough or sneeze, or you can cough or sneeze into your sleeve. Throw used tissues in a lined trash can, and immediately wash your hands with soap and water for at least 20 seconds or use an alcohol-based hand rub.  Wash your [employer] your hands often and thoroughly with soap and water for at least 20 seconds. You can use an alcohol-based hand sanitizer if soap and water are not available and if your hands are not visibly dirty. Avoid touching your eyes, nose,  and mouth with unwashed hands.   Prevention Steps for Caregivers and Household Members of Individuals Confirmed to have, or Being Evaluated for, COVID-19 Infection Being Cared for in the Home  If you live with, or provide care at home for, a person confirmed to have, or being evaluated for, COVID-19 infection please follow these guidelines  to prevent infection:  Follow healthcare providers instructions Make sure that you understand and can help the patient follow any healthcare provider instructions for all care.  Provide for the patients basic needs You should help the patient with basic needs in the home and provide support for getting groceries, prescriptions, and other personal needs.  Monitor the patients symptoms If they are getting sicker, call his or her medical provider and tell them that the patient has, or is being evaluated for, COVID-19 infection. This will help the healthcare providers office take steps to keep other people from getting infected. Ask the healthcare provider to call the local or state health department.  Limit the number of people who have contact with the patient If possible, have only one caregiver for the patient. Other household members should stay in another home or place of residence. If this is not possible, they should stay in another room, or be separated from the patient as much as possible. Use a separate bathroom, if available. Restrict visitors who do not have an essential need to be in the home.  Keep older adults, very young children, and other sick people away from the patient Keep older adults, very young children, and those who have compromised immune systems or chronic health conditions away from the patient. This includes people with chronic heart, lung, or kidney conditions, diabetes, and cancer.  Ensure good ventilation Make sure that shared spaces in the home have good air flow, such as from an air conditioner or an opened window, weather permitting.  Wash your hands often Wash your hands often and thoroughly with soap and water for at least 20 seconds. You can use an alcohol based hand sanitizer if soap and water are not available and if your hands are not visibly dirty. Avoid touching your eyes, nose, and mouth with unwashed hands. Use disposable paper towels to  dry your hands. If not available, use dedicated cloth towels and replace them when they become wet.  Wear a facemask and gloves Wear a disposable facemask at all times in the room and gloves when you touch or have contact with the patients blood, body fluids, and/or secretions or excretions, such as sweat, saliva, sputum, nasal mucus, vomit, urine, or feces.  Ensure the mask fits over your nose and mouth tightly, and do not touch it during use. Throw out disposable facemasks and gloves after using them. Do not reuse. Wash your hands immediately after removing your facemask and gloves. If your personal clothing becomes contaminated, carefully remove clothing and launder. Wash your hands after handling contaminated clothing. Place all used disposable facemasks, gloves, and other waste in a lined container before disposing them with other household waste. Remove gloves and wash your hands immediately after handling these items.  Do not share dishes, glasses, or other household items with the patient Avoid sharing household items. You should not share dishes, drinking glasses, cups, eating utensils, towels, bedding, or other items with a patient who is confirmed to have, or being evaluated for, COVID-19 infection. After the person uses these items, you should wash them thoroughly with soap and water.  Wash laundry thoroughly Immediately remove and  wash clothes or bedding that have blood, body fluids, and/or secretions or excretions, such as sweat, saliva, sputum, nasal mucus, vomit, urine, or feces, on them. Wear gloves when handling laundry from the patient. Read and follow directions on labels of laundry or clothing items and detergent. In general, wash and dry with the warmest temperatures recommended on the label.  Clean all areas the individual has used often Clean all touchable surfaces, such as counters, tabletops, doorknobs, bathroom fixtures, toilets, phones, keyboards, tablets, and bedside  tables, every day. Also, clean any surfaces that may have blood, body fluids, and/or secretions or excretions on them. Wear gloves when cleaning surfaces the patient has come in contact with. Use a diluted bleach solution (e.g., dilute bleach with 1 part bleach and 10 parts water) or a household disinfectant with a label that says EPA-registered for coronaviruses. To make a bleach solution at home, add 1 tablespoon of bleach to 1 quart (4 cups) of water. For a larger supply, add  cup of bleach to 1 gallon (16 cups) of water. Read labels of cleaning products and follow recommendations provided on product labels. Labels contain instructions for safe and effective use of the cleaning product including precautions you should take when applying the product, such as wearing gloves or eye protection and making sure you have good ventilation during use of the product. Remove gloves and wash hands immediately after cleaning.  Monitor yourself for signs and symptoms of illness Caregivers and household members are considered close contacts, should monitor their health, and will be asked to limit movement outside of the home to the extent possible. Follow the monitoring steps for close contacts listed on the symptom monitoring form.   ? If you have additional questions, contact your local health department or call the epidemiologist on call at [phone] (available 24/7). ? This guidance is subject to change. For the most up-to-date guidance from [facility], please refer to their website: [email or web address]

## 2020-05-24 NOTE — ED Triage Notes (Signed)
Pt c/o chills, cough x 3 days-reports +covid exposure-LWBS yesterday

## 2020-05-25 ENCOUNTER — Telehealth (HOSPITAL_COMMUNITY): Payer: Self-pay

## 2020-05-25 LAB — SARS CORONAVIRUS 2 BY RT PCR (HOSPITAL ORDER, PERFORMED IN ~~LOC~~ HOSPITAL LAB): SARS Coronavirus 2: POSITIVE — AB

## 2020-05-26 ENCOUNTER — Other Ambulatory Visit: Payer: Self-pay | Admitting: Adult Health

## 2020-05-26 ENCOUNTER — Ambulatory Visit (HOSPITAL_COMMUNITY)
Admission: RE | Admit: 2020-05-26 | Discharge: 2020-05-26 | Disposition: A | Discharge status: Home / Self Care | Payer: HRSA Program | Source: Ambulatory Visit | Attending: Pulmonary Disease | Admitting: Pulmonary Disease

## 2020-05-26 DIAGNOSIS — U071 COVID-19: Secondary | ICD-10-CM

## 2020-05-26 MED ORDER — ALBUTEROL SULFATE HFA 108 (90 BASE) MCG/ACT IN AERS: 2.0000 | INHALATION_SPRAY | Freq: Once | RESPIRATORY_TRACT | Status: DC | PRN

## 2020-05-26 MED ORDER — METHYLPREDNISOLONE SODIUM SUCC 125 MG IJ SOLR: 125.0000 mg | Freq: Once | INTRAMUSCULAR | Status: DC | PRN

## 2020-05-26 MED ORDER — FAMOTIDINE IN NACL 20-0.9 MG/50ML-% IV SOLN: 20.0000 mg | Freq: Once | INTRAVENOUS | Status: DC | PRN

## 2020-05-26 MED ORDER — EPINEPHRINE 0.3 MG/0.3ML IJ SOAJ: 0.3000 mg | Freq: Once | INTRAMUSCULAR | Status: DC | PRN

## 2020-05-26 MED ORDER — DIPHENHYDRAMINE HCL 50 MG/ML IJ SOLN: 50.0000 mg | Freq: Once | INTRAMUSCULAR | Status: DC | PRN

## 2020-05-26 MED ORDER — SODIUM CHLORIDE 0.9 % IV SOLN: INTRAVENOUS | Status: DC | PRN

## 2020-05-26 MED ORDER — SODIUM CHLORIDE 0.9 % IV SOLN
Freq: Once | INTRAVENOUS | Status: AC
  Filled 2020-05-26: qty 600

## 2020-05-26 NOTE — Discharge Instructions (Signed)

## 2020-05-26 NOTE — Progress Notes (Signed)
  Diagnosis: COVID-19  Physician: Dr. Wright  Procedure: Cov[facility] Infusion Clinic Med: casirivimab\imdevimab infusion - Provided patient with casirivimab\imdevimab fact sheet for patients, parents and caregivers prior to infusion.  Complications: No immediate complications noted.  Discharge: Discharged home   Ameya Kutz M Dianah Pruett 05/26/2020  

## 2020-05-26 NOTE — Progress Notes (Signed)
I connected by phone with Jose Bean on 05/26/2020 at 8:53 AM to discuss the potential use of a new treatment for mild to moderate COVID-19 viral infection in non-hospitalized patients.  This patient is a 39 y.o. male that meets the FDA criteria for Emergency Use Authorization of COVID monoclonal antibody casirivimab/imdevimab.  Has a (+) direct SARS-CoV-2 viral test result  Has mild or moderate COVID-19   Is NOT hospitalized due to COVID-19  Is within 10 days of symptom onset  Has at least one of the high risk factor(s) for progression to severe COVID-19 and/or hospitalization as defined in EUA.  Specific high risk criteria : BMI > 25   I have spoken and communicated the following to the patient or parent/caregiver regarding COVID monoclonal antibody treatment:  1. FDA has authorized the emergency use for the treatment of mild to moderate COVID-19 in adults and pediatric patients with positive results of direct SARS-CoV-2 viral testing who are 49 years of age and older weighing at least 40 kg, and who are at high risk for progressing to severe COVID-19 and/or hospitalization.  2. The significant known and potential risks and benefits of COVID monoclonal antibody, and the extent to which such potential risks and benefits are unknown.  3. Information on available alternative treatments and the risks and benefits of those alternatives, including clinical trials.  4. Patients treated with COVID monoclonal antibody should continue to self-isolate and use infection control measures (e.g., wear mask, isolate, social distance, avoid sharing personal items, clean and disinfect "high touch" surfaces, and frequent handwashing) according to CDC guidelines.   5. The patient or parent/caregiver has the option to accept or refuse COVID monoclonal antibody treatment.  After reviewing this information with the patient, The patient agreed to proceed with receiving casirivimab\imdevimab infusion and  will be provided a copy of the Fact sheet prior to receiving the infusion. Noreene Filbert 05/26/2020 8:53 AM
